# Patient Record
Sex: Female | Born: 1960 | Race: Black or African American | Hispanic: No | Marital: Married | Smoking: Never smoker
Health system: Southern US, Community
[De-identification: ages and names within clinical notes are randomized; demographics above are authoritative.]

## PROBLEM LIST (undated history)

## (undated) DIAGNOSIS — F32A Depression, unspecified: Secondary | ICD-10-CM

## (undated) DIAGNOSIS — D219 Benign neoplasm of connective and other soft tissue, unspecified: Secondary | ICD-10-CM

## (undated) DIAGNOSIS — F329 Major depressive disorder, single episode, unspecified: Secondary | ICD-10-CM

## (undated) DIAGNOSIS — F419 Anxiety disorder, unspecified: Secondary | ICD-10-CM

## (undated) HISTORY — DX: Anxiety disorder, unspecified: F41.9

## (undated) HISTORY — DX: Major depressive disorder, single episode, unspecified: F32.9

## (undated) HISTORY — DX: Depression, unspecified: F32.A

## (undated) HISTORY — DX: Benign neoplasm of connective and other soft tissue, unspecified: D21.9

## (undated) HISTORY — PX: UTERINE FIBROID SURGERY: SHX826

## (undated) HISTORY — PX: BREAST EXCISIONAL BIOPSY: SUR124

---

## 1999-05-02 ENCOUNTER — Ambulatory Visit (HOSPITAL_COMMUNITY): Admission: RE | Admit: 1999-05-02 | Discharge: 1999-05-02 | Payer: Self-pay | Admitting: General Surgery

## 1999-05-02 ENCOUNTER — Encounter (HOSPITAL_BASED_OUTPATIENT_CLINIC_OR_DEPARTMENT_OTHER): Payer: Self-pay | Admitting: General Surgery

## 2000-08-03 ENCOUNTER — Ambulatory Visit (HOSPITAL_COMMUNITY): Admission: RE | Admit: 2000-08-03 | Discharge: 2000-08-03 | Payer: Self-pay | Admitting: Obstetrics and Gynecology

## 2000-08-03 ENCOUNTER — Encounter: Payer: Self-pay | Admitting: Obstetrics and Gynecology

## 2001-09-01 ENCOUNTER — Other Ambulatory Visit: Admission: RE | Admit: 2001-09-01 | Discharge: 2001-09-01 | Payer: Self-pay | Admitting: Obstetrics and Gynecology

## 2001-09-14 ENCOUNTER — Ambulatory Visit (HOSPITAL_COMMUNITY): Admission: RE | Admit: 2001-09-14 | Discharge: 2001-09-14 | Payer: Self-pay | Admitting: Obstetrics and Gynecology

## 2001-09-14 ENCOUNTER — Encounter: Payer: Self-pay | Admitting: Obstetrics and Gynecology

## 2002-08-17 ENCOUNTER — Other Ambulatory Visit: Admission: RE | Admit: 2002-08-17 | Discharge: 2002-08-17 | Payer: Self-pay | Admitting: Obstetrics and Gynecology

## 2002-09-29 ENCOUNTER — Ambulatory Visit (HOSPITAL_COMMUNITY): Admission: RE | Admit: 2002-09-29 | Discharge: 2002-09-29 | Payer: Self-pay | Admitting: Obstetrics and Gynecology

## 2002-09-29 ENCOUNTER — Encounter: Payer: Self-pay | Admitting: Obstetrics and Gynecology

## 2003-07-14 ENCOUNTER — Ambulatory Visit (HOSPITAL_COMMUNITY): Admission: RE | Admit: 2003-07-14 | Discharge: 2003-07-14 | Payer: Self-pay | Admitting: Family Medicine

## 2003-07-24 ENCOUNTER — Ambulatory Visit (HOSPITAL_COMMUNITY): Admission: RE | Admit: 2003-07-24 | Discharge: 2003-07-24 | Payer: Self-pay | Admitting: Family Medicine

## 2003-10-10 ENCOUNTER — Other Ambulatory Visit: Admission: RE | Admit: 2003-10-10 | Discharge: 2003-10-10 | Payer: Self-pay | Admitting: Obstetrics and Gynecology

## 2004-05-21 ENCOUNTER — Other Ambulatory Visit: Admission: RE | Admit: 2004-05-21 | Discharge: 2004-05-21 | Payer: Self-pay | Admitting: Obstetrics and Gynecology

## 2004-09-19 ENCOUNTER — Emergency Department (HOSPITAL_COMMUNITY): Admission: EM | Admit: 2004-09-19 | Discharge: 2004-09-19 | Payer: Self-pay | Admitting: Emergency Medicine

## 2004-10-22 ENCOUNTER — Other Ambulatory Visit: Admission: RE | Admit: 2004-10-22 | Discharge: 2004-10-22 | Payer: Self-pay | Admitting: Obstetrics and Gynecology

## 2004-10-29 ENCOUNTER — Ambulatory Visit (HOSPITAL_COMMUNITY): Admission: RE | Admit: 2004-10-29 | Discharge: 2004-10-29 | Payer: Self-pay | Admitting: Obstetrics and Gynecology

## 2005-05-02 ENCOUNTER — Encounter: Admission: RE | Admit: 2005-05-02 | Discharge: 2005-05-02 | Payer: Self-pay | Admitting: Obstetrics and Gynecology

## 2005-11-11 ENCOUNTER — Encounter: Admission: RE | Admit: 2005-11-11 | Discharge: 2005-11-11 | Payer: Self-pay | Admitting: Obstetrics and Gynecology

## 2005-11-20 ENCOUNTER — Other Ambulatory Visit: Admission: RE | Admit: 2005-11-20 | Discharge: 2005-11-20 | Payer: Self-pay | Admitting: Obstetrics and Gynecology

## 2006-11-30 ENCOUNTER — Ambulatory Visit (HOSPITAL_COMMUNITY): Admission: RE | Admit: 2006-11-30 | Discharge: 2006-11-30 | Payer: Self-pay | Admitting: Obstetrics and Gynecology

## 2007-12-13 ENCOUNTER — Ambulatory Visit (HOSPITAL_COMMUNITY): Admission: RE | Admit: 2007-12-13 | Discharge: 2007-12-13 | Payer: Self-pay | Admitting: Obstetrics and Gynecology

## 2008-12-21 ENCOUNTER — Ambulatory Visit (HOSPITAL_COMMUNITY): Admission: RE | Admit: 2008-12-21 | Discharge: 2008-12-21 | Payer: Self-pay | Admitting: Obstetrics and Gynecology

## 2010-01-10 ENCOUNTER — Ambulatory Visit (HOSPITAL_COMMUNITY): Admission: RE | Admit: 2010-01-10 | Discharge: 2010-01-10 | Payer: Self-pay | Admitting: Obstetrics and Gynecology

## 2011-02-25 ENCOUNTER — Other Ambulatory Visit (HOSPITAL_COMMUNITY): Payer: Self-pay | Admitting: Obstetrics and Gynecology

## 2011-02-25 DIAGNOSIS — Z1231 Encounter for screening mammogram for malignant neoplasm of breast: Secondary | ICD-10-CM

## 2011-03-06 ENCOUNTER — Ambulatory Visit (HOSPITAL_COMMUNITY)
Admission: RE | Admit: 2011-03-06 | Discharge: 2011-03-06 | Disposition: A | Discharge status: Home / Self Care | Payer: BC Managed Care – PPO | Source: Ambulatory Visit | Attending: Obstetrics and Gynecology | Admitting: Obstetrics and Gynecology

## 2011-03-06 DIAGNOSIS — Z1231 Encounter for screening mammogram for malignant neoplasm of breast: Secondary | ICD-10-CM | POA: Insufficient documentation

## 2011-12-29 ENCOUNTER — Other Ambulatory Visit: Payer: Self-pay | Admitting: Obstetrics and Gynecology

## 2011-12-29 MED ORDER — NORETHINDRONE 0.35 MG PO TABS: 1.0000 | ORAL_TABLET | Freq: Every day | ORAL | Status: DC

## 2011-12-29 NOTE — Telephone Encounter (Signed)
Chandra/cht received 

## 2011-12-29 NOTE — Telephone Encounter (Signed)
Tc to pt. Wants Camila written for a 30 day supply monthly vs 90 day supply. Rx e-pres to pharm on file. Pt voices undesrtanding.

## 2012-01-08 ENCOUNTER — Other Ambulatory Visit: Payer: Self-pay | Admitting: Obstetrics and Gynecology

## 2012-01-08 NOTE — Telephone Encounter (Signed)
Tabitha Zimmerman/epic 

## 2012-01-08 NOTE — Telephone Encounter (Signed)
Tc from pt per telephone call. Pt was given rf of generic Camila last week;however opts to have Camila brand name only. Rx for Camila(brand name only) with 5 rf's called to [facility]([address]) [phone]. Pt voices understanding. Pt will complete Norethindrone pill pack with this cycle and start Camila with next cycle.

## 2012-05-10 ENCOUNTER — Other Ambulatory Visit: Payer: Self-pay | Admitting: Obstetrics and Gynecology

## 2012-05-10 DIAGNOSIS — Z1231 Encounter for screening mammogram for malignant neoplasm of breast: Secondary | ICD-10-CM

## 2012-05-26 ENCOUNTER — Ambulatory Visit (HOSPITAL_COMMUNITY)
Admission: RE | Admit: 2012-05-26 | Discharge: 2012-05-26 | Disposition: A | Discharge status: Home / Self Care | Payer: BC Managed Care – HMO | Source: Ambulatory Visit | Attending: Obstetrics and Gynecology | Admitting: Obstetrics and Gynecology

## 2012-05-26 DIAGNOSIS — Z1231 Encounter for screening mammogram for malignant neoplasm of breast: Secondary | ICD-10-CM | POA: Insufficient documentation

## 2012-05-27 ENCOUNTER — Telehealth: Payer: Self-pay | Admitting: Obstetrics and Gynecology

## 2012-05-27 ENCOUNTER — Ambulatory Visit (HOSPITAL_COMMUNITY): Payer: BC Managed Care – PPO

## 2012-05-27 MED ORDER — NORETHINDRONE 0.35 MG PO TABS: 1.0000 | ORAL_TABLET | Freq: Every day | ORAL | Status: DC

## 2012-05-27 NOTE — Telephone Encounter (Signed)
Tc to pt per telephone call. Rx for Camilla e-pres to pharm on file. Pt voices understanding.

## 2012-06-14 ENCOUNTER — Other Ambulatory Visit: Payer: Self-pay | Admitting: Obstetrics and Gynecology

## 2012-06-23 ENCOUNTER — Encounter: Payer: Self-pay | Admitting: Obstetrics and Gynecology

## 2012-06-23 DIAGNOSIS — R922 Inconclusive mammogram: Secondary | ICD-10-CM | POA: Insufficient documentation

## 2012-09-02 ENCOUNTER — Encounter: Payer: Self-pay | Admitting: Obstetrics and Gynecology

## 2012-09-06 ENCOUNTER — Ambulatory Visit: Payer: BC Managed Care – PPO | Admitting: Obstetrics and Gynecology

## 2012-09-06 ENCOUNTER — Encounter: Payer: Self-pay | Admitting: Obstetrics and Gynecology

## 2012-09-06 VITALS — BP 128/74 | HR 94 | Ht 63.0 in | Wt 167.0 lb

## 2012-09-06 DIAGNOSIS — N912 Amenorrhea, unspecified: Secondary | ICD-10-CM

## 2012-09-06 DIAGNOSIS — Z124 Encounter for screening for malignant neoplasm of cervix: Secondary | ICD-10-CM

## 2012-09-06 DIAGNOSIS — R922 Inconclusive mammogram: Secondary | ICD-10-CM

## 2012-09-06 DIAGNOSIS — R110 Nausea: Secondary | ICD-10-CM

## 2012-09-06 DIAGNOSIS — D219 Benign neoplasm of connective and other soft tissue, unspecified: Secondary | ICD-10-CM

## 2012-09-06 MED ORDER — PROMETHAZINE HCL 25 MG PO TABS: 25.0000 mg | ORAL_TABLET | Freq: Four times a day (QID) | ORAL | Status: AC | PRN

## 2012-09-06 NOTE — Progress Notes (Signed)
Subjective:  Last Pap: 02/07/09 WNL: Yes Regular Periods:no  No menses since changing to progesterone only pill 2 years ago. Contraception: pill  Monthly Breast exam:yes Tetanus<69yrs:yes Nl.Bladder Function:yes Daily BMs:no Healthy Diet:no Calcium:no Mammogram:yes Date of Mammogram: 05/2012 Exercise:no Have often Exercise: n/a Seatbelt: yes Abuse at home: no Stressful work:yes Sigmoid-colonoscopy: yes 03/2012 Bone Density: No PCP: Dr. Elias Else Change in PMH: no changes  Change in [facility]: no changes  INA SCRIVENS is a 52 y.o. female, G2P1011, who presents for an annual exam. No menopausal sx    History   Social History  . Marital Status: Married    Spouse Name: N/A    Number of Children: N/A  . Years of Education: N/A   Social History Main Topics  . Smoking status: Never Smoker   . Smokeless tobacco: None  . Alcohol Use: No  . Drug Use: No  . Sexually Active: Yes    Birth Control/ Protection: Pill     Comment: camilla   Other Topics Concern  . None   Social History Narrative  . None    Menstrual cycle:   LMP: No LMP recorded. Patient is not currently having periods (Reason: Oral contraceptives).           Cycle: none  The following portions of the patient's history were reviewed and updated as appropriate: allergies, current medications, past family history, past medical history, past social history, past surgical history and problem list.  Review of Systems Pertinent items are noted in HPI. Breast:Negative for breast lump,nipple discharge or nipple retraction Gastrointestinal: Negative for abdominal pain, change in bowel habits or rectal bleeding.  Has chronic nausea which has been worked up without etiology noted, had resolved, but has returned in the last few weeks Urinary:negative   Objective:    BP 128/74  Pulse 94  Ht 5\' 3"  (1.6 m)  Wt 167 lb (75.751 kg)  BMI 29.59 kg/m2    Weight:  Wt Readings from Last 1 Encounters:  09/06/12 167 lb  (75.751 kg)          BMI: Body mass index is 29.59 kg/(m^2).  General Appearance: Alert, appropriate appearance for age. No acute distress HEENT: Grossly normal Neck / Thyroid: Supple, no masses, nodes or enlargement Lungs: clear to auscultation bilaterally Back: No CVA tenderness Breast Exam: No masses or nodes.No dimpling, nipple retraction or discharge. Cardiovascular: Regular rate and rhythm. S1, S2, no murmur Gastrointestinal: Soft, non-tender, no masses or organomegaly Pelvic Exam: External genitalia: normal general appearance Vaginal: atrophic mucosa Cervix: displaced anteriorly by large posterior uterine fibroid Adnexa: non palpable and not separable from fibroid uterus Uterus: irregular enlargement probably 10-12 wks size (last U/S 12/12 was 12 wks size with 7 measurable fibroids) Exam limited by Large fibroids Rectovaginal: normal rectal, no masses Lymphatic Exam: Non-palpable nodes in neck, clavicular, axillary, or inguinal regions Skin: no rash or abnormalities Neurologic: Normal gait and speech, no tremor  Psychiatric: Alert and oriented, appropriate affect.   Wet Prep:not applicable Urinalysis:not applicable UPT: Not done   Assessment:    Large asymptomatic fibroids, recurrent s/p myomectomy in 1998  Dense breasts without other risk factors   Plan:    mammogram pap smear with HR HPV  return annually or prn Contraception:oral progesterone-only contraceptive FSH, E2.  If menopausal, will continue bcps for 1 additional year then d/c   Dierdre Forth MD

## 2012-09-09 ENCOUNTER — Other Ambulatory Visit: Payer: Self-pay

## 2012-09-09 MED ORDER — NORETHINDRONE 0.35 MG PO TABS: 1.0000 | ORAL_TABLET | Freq: Every day | ORAL | Status: AC

## 2013-09-14 ENCOUNTER — Other Ambulatory Visit: Payer: Self-pay | Admitting: Obstetrics and Gynecology

## 2013-09-14 DIAGNOSIS — Z1231 Encounter for screening mammogram for malignant neoplasm of breast: Secondary | ICD-10-CM

## 2013-09-20 ENCOUNTER — Ambulatory Visit (HOSPITAL_COMMUNITY)
Admission: RE | Admit: 2013-09-20 | Discharge: 2013-09-20 | Disposition: A | Discharge status: Home / Self Care | Payer: 59 | Source: Ambulatory Visit | Attending: Obstetrics and Gynecology | Admitting: Obstetrics and Gynecology

## 2013-09-20 DIAGNOSIS — Z1231 Encounter for screening mammogram for malignant neoplasm of breast: Secondary | ICD-10-CM | POA: Insufficient documentation

## 2014-05-15 ENCOUNTER — Encounter: Payer: Self-pay | Admitting: Obstetrics and Gynecology

## 2014-12-06 ENCOUNTER — Other Ambulatory Visit (HOSPITAL_COMMUNITY): Payer: Self-pay | Admitting: Obstetrics and Gynecology

## 2014-12-06 DIAGNOSIS — Z1231 Encounter for screening mammogram for malignant neoplasm of breast: Secondary | ICD-10-CM

## 2014-12-14 ENCOUNTER — Ambulatory Visit (HOSPITAL_COMMUNITY)
Admission: RE | Admit: 2014-12-14 | Discharge: 2014-12-14 | Disposition: A | Discharge status: Home / Self Care | Payer: Managed Care, Other (non HMO) | Source: Ambulatory Visit | Attending: Obstetrics and Gynecology | Admitting: Obstetrics and Gynecology

## 2014-12-14 DIAGNOSIS — Z1231 Encounter for screening mammogram for malignant neoplasm of breast: Secondary | ICD-10-CM | POA: Insufficient documentation

## 2016-02-26 ENCOUNTER — Other Ambulatory Visit: Payer: Self-pay | Admitting: Obstetrics and Gynecology

## 2016-02-26 DIAGNOSIS — Z1231 Encounter for screening mammogram for malignant neoplasm of breast: Secondary | ICD-10-CM

## 2016-03-05 ENCOUNTER — Other Ambulatory Visit: Payer: Self-pay | Admitting: Obstetrics and Gynecology

## 2016-03-05 ENCOUNTER — Ambulatory Visit
Admission: RE | Admit: 2016-03-05 | Discharge: 2016-03-05 | Disposition: A | Discharge status: Home / Self Care | Payer: Managed Care, Other (non HMO) | Source: Ambulatory Visit | Attending: Obstetrics and Gynecology | Admitting: Obstetrics and Gynecology

## 2016-03-05 DIAGNOSIS — Z1231 Encounter for screening mammogram for malignant neoplasm of breast: Secondary | ICD-10-CM

## 2016-03-05 DIAGNOSIS — N63 Unspecified lump in unspecified breast: Secondary | ICD-10-CM

## 2016-03-11 ENCOUNTER — Other Ambulatory Visit: Payer: Self-pay | Admitting: Obstetrics and Gynecology

## 2016-03-11 DIAGNOSIS — N63 Unspecified lump in unspecified breast: Secondary | ICD-10-CM

## 2016-03-14 ENCOUNTER — Ambulatory Visit
Admission: RE | Admit: 2016-03-14 | Discharge: 2016-03-14 | Disposition: A | Discharge status: Home / Self Care | Payer: Managed Care, Other (non HMO) | Source: Ambulatory Visit | Attending: Obstetrics and Gynecology | Admitting: Obstetrics and Gynecology

## 2016-03-14 DIAGNOSIS — N63 Unspecified lump in unspecified breast: Secondary | ICD-10-CM

## 2017-05-19 ENCOUNTER — Other Ambulatory Visit: Payer: Self-pay | Admitting: Obstetrics and Gynecology

## 2017-05-19 ENCOUNTER — Ambulatory Visit
Admission: RE | Admit: 2017-05-19 | Discharge: 2017-05-19 | Disposition: A | Discharge status: Home / Self Care | Payer: BLUE CROSS/BLUE SHIELD | Source: Ambulatory Visit | Attending: Obstetrics and Gynecology | Admitting: Obstetrics and Gynecology

## 2017-05-19 DIAGNOSIS — Z1231 Encounter for screening mammogram for malignant neoplasm of breast: Secondary | ICD-10-CM | POA: Diagnosis not present

## 2017-05-20 ENCOUNTER — Ambulatory Visit: Payer: Managed Care, Other (non HMO)

## 2017-07-02 DIAGNOSIS — Z01411 Encounter for gynecological examination (general) (routine) with abnormal findings: Secondary | ICD-10-CM | POA: Diagnosis not present

## 2017-07-02 DIAGNOSIS — D259 Leiomyoma of uterus, unspecified: Secondary | ICD-10-CM | POA: Diagnosis not present

## 2017-07-02 DIAGNOSIS — L900 Lichen sclerosus et atrophicus: Secondary | ICD-10-CM | POA: Diagnosis not present

## 2017-08-27 DIAGNOSIS — Z23 Encounter for immunization: Secondary | ICD-10-CM | POA: Diagnosis not present

## 2017-08-27 DIAGNOSIS — R110 Nausea: Secondary | ICD-10-CM | POA: Diagnosis not present

## 2017-08-27 DIAGNOSIS — Z0000 Encounter for general adult medical examination without abnormal findings: Secondary | ICD-10-CM | POA: Diagnosis not present

## 2017-08-27 DIAGNOSIS — F324 Major depressive disorder, single episode, in partial remission: Secondary | ICD-10-CM | POA: Diagnosis not present

## 2017-08-27 DIAGNOSIS — E7800 Pure hypercholesterolemia, unspecified: Secondary | ICD-10-CM | POA: Diagnosis not present

## 2017-09-10 DIAGNOSIS — E7800 Pure hypercholesterolemia, unspecified: Secondary | ICD-10-CM | POA: Diagnosis not present

## 2017-09-10 DIAGNOSIS — Z1159 Encounter for screening for other viral diseases: Secondary | ICD-10-CM | POA: Diagnosis not present

## 2018-05-18 ENCOUNTER — Other Ambulatory Visit: Payer: Self-pay | Admitting: Obstetrics and Gynecology

## 2018-05-18 DIAGNOSIS — Z1231 Encounter for screening mammogram for malignant neoplasm of breast: Secondary | ICD-10-CM

## 2018-06-28 ENCOUNTER — Ambulatory Visit
Admission: RE | Admit: 2018-06-28 | Discharge: 2018-06-28 | Disposition: A | Discharge status: Home / Self Care | Payer: BLUE CROSS/BLUE SHIELD | Source: Ambulatory Visit | Attending: Obstetrics and Gynecology | Admitting: Obstetrics and Gynecology

## 2018-06-28 DIAGNOSIS — Z1231 Encounter for screening mammogram for malignant neoplasm of breast: Secondary | ICD-10-CM

## 2018-07-08 DIAGNOSIS — L900 Lichen sclerosus et atrophicus: Secondary | ICD-10-CM | POA: Diagnosis not present

## 2018-07-08 DIAGNOSIS — Z01411 Encounter for gynecological examination (general) (routine) with abnormal findings: Secondary | ICD-10-CM | POA: Diagnosis not present

## 2018-07-08 DIAGNOSIS — Z6829 Body mass index (BMI) 29.0-29.9, adult: Secondary | ICD-10-CM | POA: Diagnosis not present

## 2018-07-08 DIAGNOSIS — Z124 Encounter for screening for malignant neoplasm of cervix: Secondary | ICD-10-CM | POA: Diagnosis not present

## 2018-07-25 DIAGNOSIS — J209 Acute bronchitis, unspecified: Secondary | ICD-10-CM | POA: Diagnosis not present

## 2018-10-28 DIAGNOSIS — Z0000 Encounter for general adult medical examination without abnormal findings: Secondary | ICD-10-CM | POA: Diagnosis not present

## 2018-10-28 DIAGNOSIS — R110 Nausea: Secondary | ICD-10-CM | POA: Diagnosis not present

## 2018-10-28 DIAGNOSIS — E7800 Pure hypercholesterolemia, unspecified: Secondary | ICD-10-CM | POA: Diagnosis not present

## 2018-10-28 DIAGNOSIS — Z13220 Encounter for screening for lipoid disorders: Secondary | ICD-10-CM | POA: Diagnosis not present

## 2018-10-28 DIAGNOSIS — F324 Major depressive disorder, single episode, in partial remission: Secondary | ICD-10-CM | POA: Diagnosis not present

## 2018-11-01 DIAGNOSIS — E7800 Pure hypercholesterolemia, unspecified: Secondary | ICD-10-CM | POA: Diagnosis not present

## 2018-12-17 DIAGNOSIS — K219 Gastro-esophageal reflux disease without esophagitis: Secondary | ICD-10-CM | POA: Diagnosis not present

## 2018-12-17 DIAGNOSIS — R1032 Left lower quadrant pain: Secondary | ICD-10-CM | POA: Diagnosis not present

## 2018-12-17 DIAGNOSIS — K5900 Constipation, unspecified: Secondary | ICD-10-CM | POA: Diagnosis not present

## 2019-01-18 ENCOUNTER — Other Ambulatory Visit: Payer: Self-pay | Admitting: *Deleted

## 2019-01-18 DIAGNOSIS — Z20822 Contact with and (suspected) exposure to covid-19: Secondary | ICD-10-CM

## 2019-01-24 LAB — NOVEL CORONAVIRUS, NAA: SARS-CoV-2, NAA: NOT DETECTED

## 2019-01-25 ENCOUNTER — Telehealth: Payer: Self-pay | Admitting: Family Medicine

## 2019-01-25 NOTE — Telephone Encounter (Signed)
Patient called and received test results

## 2019-06-24 ENCOUNTER — Other Ambulatory Visit: Payer: Self-pay | Admitting: Family Medicine

## 2019-06-24 DIAGNOSIS — Z1231 Encounter for screening mammogram for malignant neoplasm of breast: Secondary | ICD-10-CM

## 2019-06-30 ENCOUNTER — Other Ambulatory Visit: Payer: Self-pay

## 2019-06-30 ENCOUNTER — Ambulatory Visit
Admission: RE | Admit: 2019-06-30 | Discharge: 2019-06-30 | Disposition: A | Discharge status: Home / Self Care | Payer: BC Managed Care – PPO | Source: Ambulatory Visit | Attending: Family Medicine | Admitting: Family Medicine

## 2019-06-30 DIAGNOSIS — Z1231 Encounter for screening mammogram for malignant neoplasm of breast: Secondary | ICD-10-CM

## 2019-07-28 DIAGNOSIS — Z20822 Contact with and (suspected) exposure to covid-19: Secondary | ICD-10-CM | POA: Diagnosis not present

## 2019-10-31 DIAGNOSIS — E7800 Pure hypercholesterolemia, unspecified: Secondary | ICD-10-CM | POA: Diagnosis not present

## 2019-10-31 DIAGNOSIS — Z0000 Encounter for general adult medical examination without abnormal findings: Secondary | ICD-10-CM | POA: Diagnosis not present

## 2019-10-31 DIAGNOSIS — Z130 Encounter for screening for diseases of the blood and blood-forming organs and certain disorders involving the immune mechanism: Secondary | ICD-10-CM | POA: Diagnosis not present

## 2020-09-29 IMAGING — MG DIGITAL SCREENING BILAT W/ TOMO W/ CAD
8 series · 8 of 24 positions shown · non-contrast
Comparison: Previous exam(s).

CLINICAL DATA: Screening.

EXAM:
DIGITAL SCREENING BILATERAL MAMMOGRAM WITH TOMO AND CAD

[R CC synth-2D]
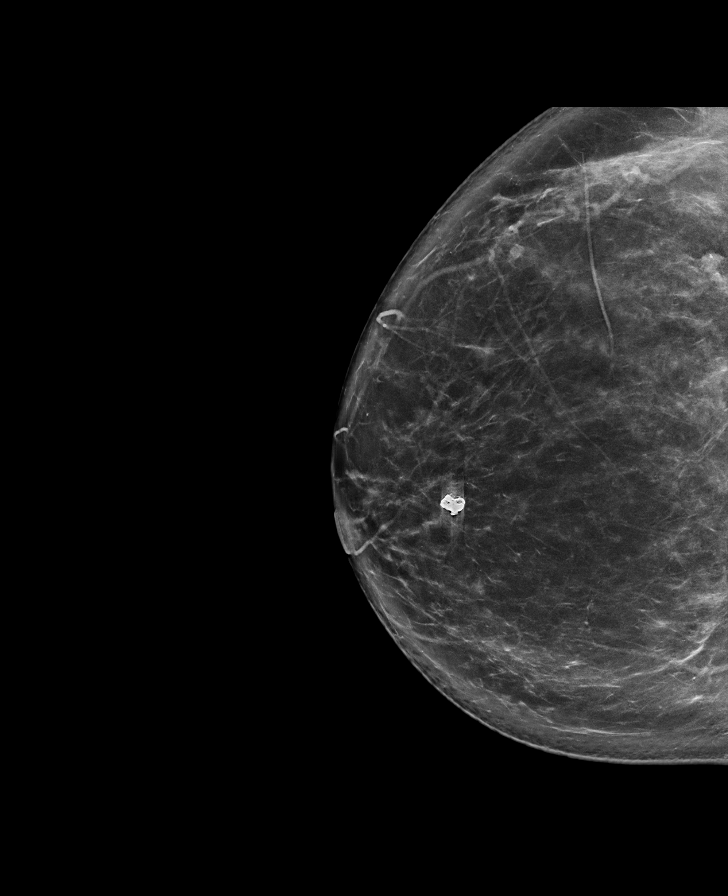

[R MLO synth-2D]
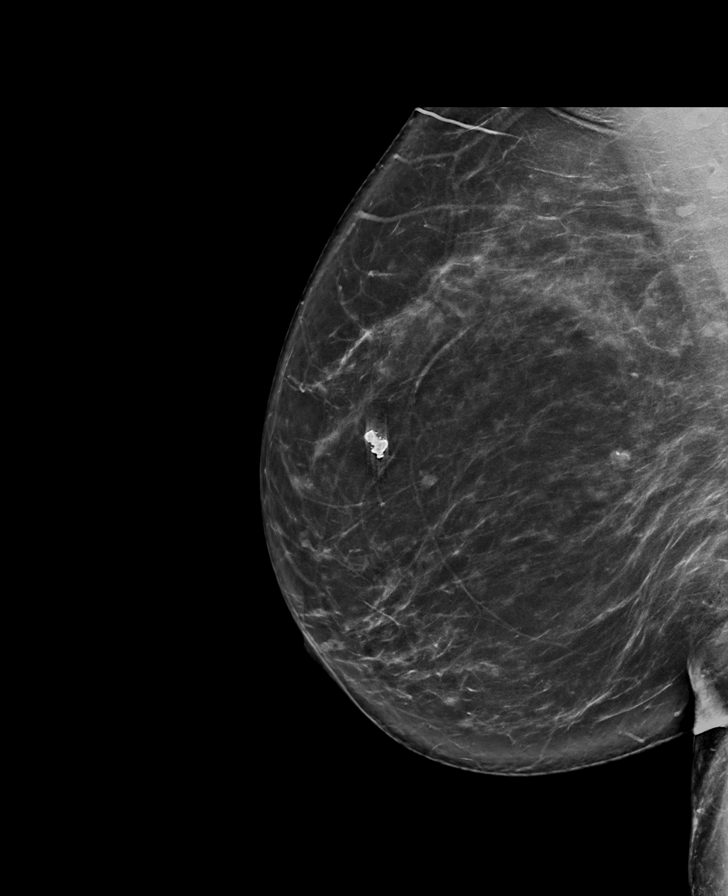

[L MLO synth-2D]
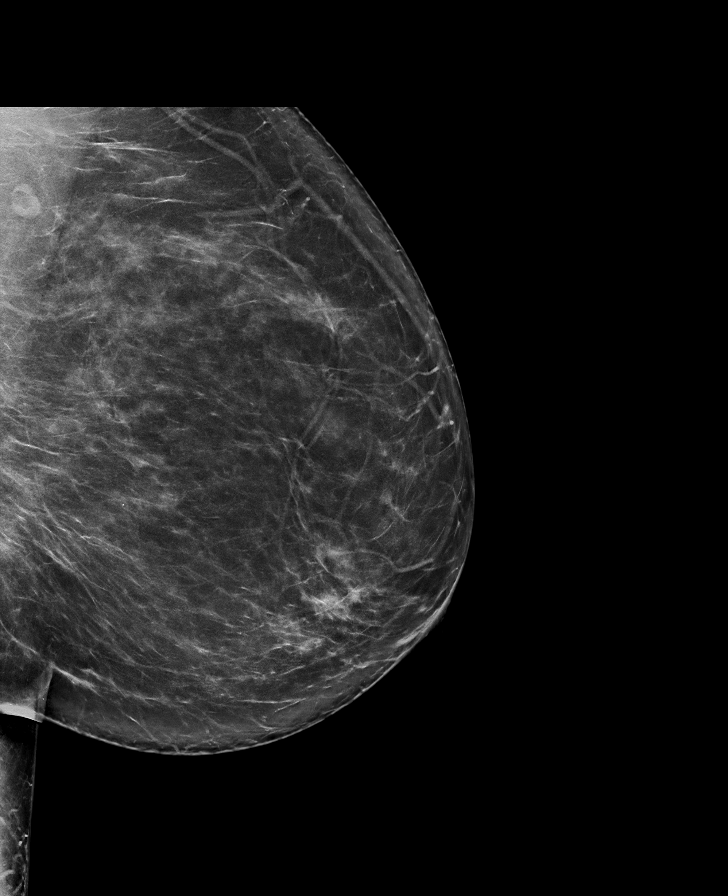

[L CC synth-2D]
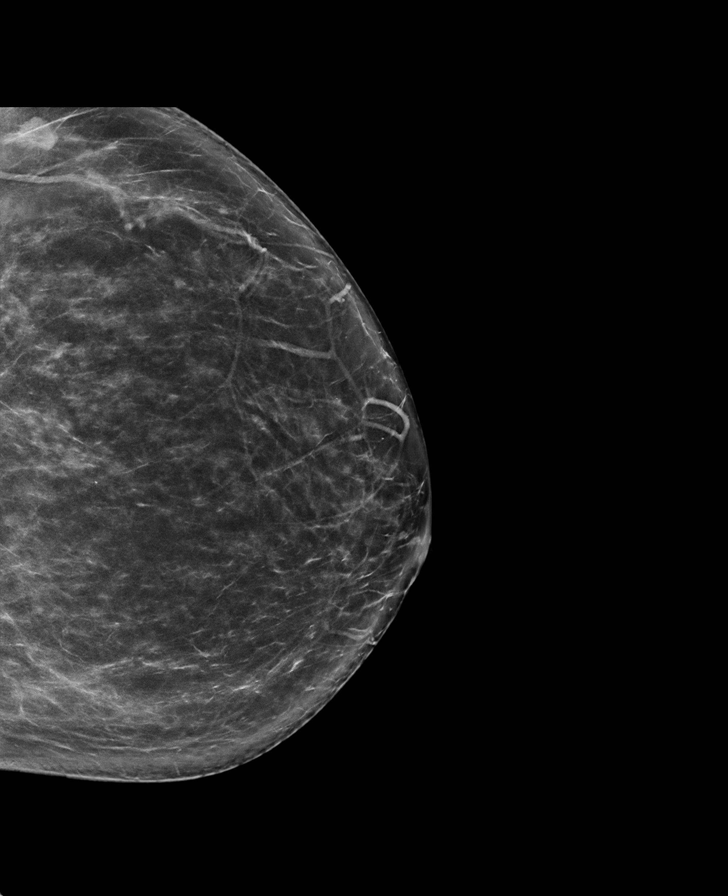

[R CC tomo · tomo slice 47/93.0]
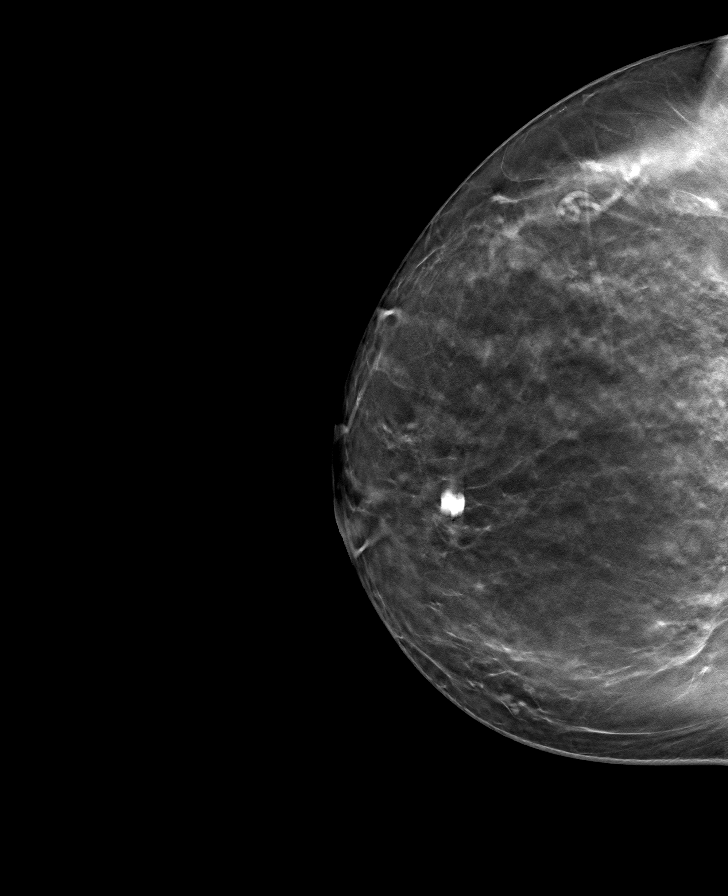

[L CC tomo · tomo slice 42/83.0]
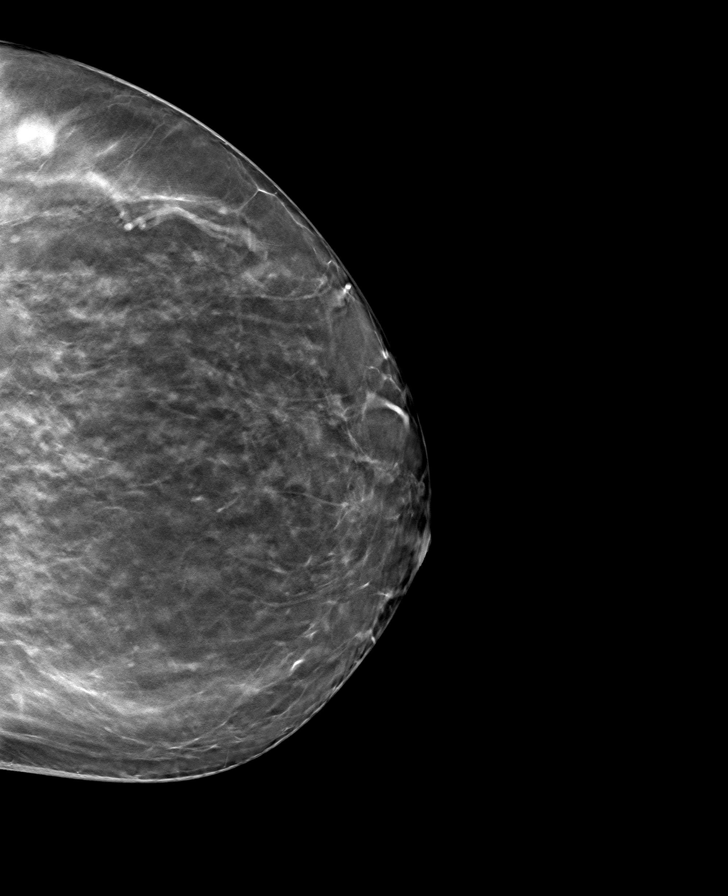

[R MLO tomo · tomo slice 47/93.0]
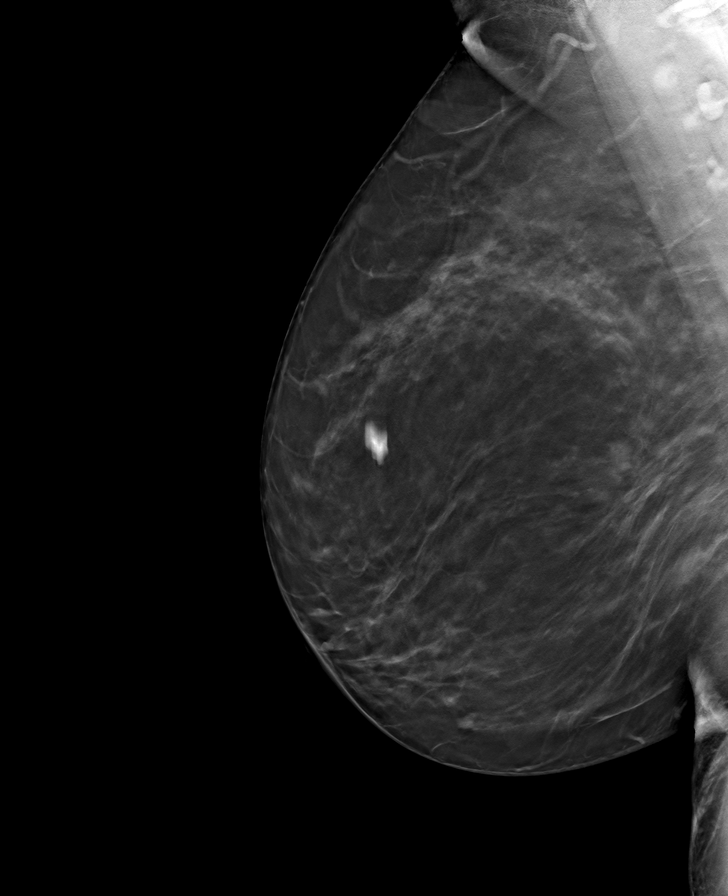

[L MLO tomo · tomo slice 45/90.0]
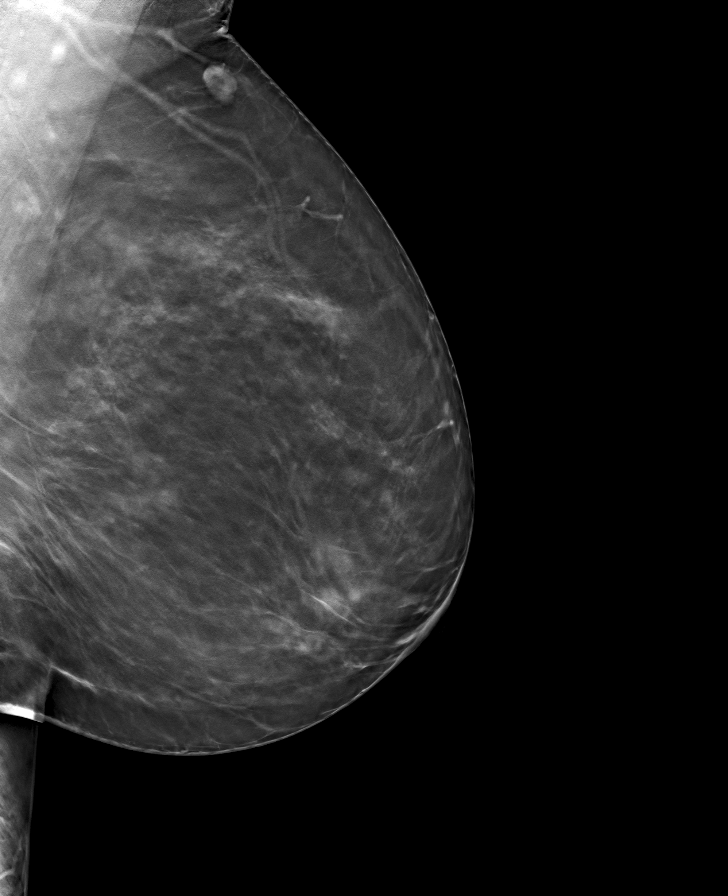

[8 of 24 positions shown; findings below may reference images not displayed]

ACR Breast Density Category b: There are scattered areas of
fibroglandular density.
FINDINGS: There are no findings suspicious for malignancy. Images were
processed with CAD.
IMPRESSION: No mammographic evidence of malignancy. A result letter of this
screening mammogram will be mailed directly to the patient.

RECOMMENDATION:
Screening mammogram in one year. (Code:[id number])

BI-RADS CATEGORY  1: Negative.

## 2020-11-06 DIAGNOSIS — E7800 Pure hypercholesterolemia, unspecified: Secondary | ICD-10-CM | POA: Diagnosis not present

## 2020-11-06 DIAGNOSIS — Z0000 Encounter for general adult medical examination without abnormal findings: Secondary | ICD-10-CM | POA: Diagnosis not present

## 2020-11-06 DIAGNOSIS — R110 Nausea: Secondary | ICD-10-CM | POA: Diagnosis not present

## 2021-01-24 DIAGNOSIS — Z6830 Body mass index (BMI) 30.0-30.9, adult: Secondary | ICD-10-CM | POA: Diagnosis not present

## 2021-01-24 DIAGNOSIS — Z01419 Encounter for gynecological examination (general) (routine) without abnormal findings: Secondary | ICD-10-CM | POA: Diagnosis not present

## 2021-02-19 DIAGNOSIS — U071 COVID-19: Secondary | ICD-10-CM | POA: Diagnosis not present

## 2021-03-12 DIAGNOSIS — Z1231 Encounter for screening mammogram for malignant neoplasm of breast: Secondary | ICD-10-CM | POA: Diagnosis not present

## 2021-11-26 DIAGNOSIS — F330 Major depressive disorder, recurrent, mild: Secondary | ICD-10-CM | POA: Diagnosis not present

## 2021-11-26 DIAGNOSIS — Z0000 Encounter for general adult medical examination without abnormal findings: Secondary | ICD-10-CM | POA: Diagnosis not present

## 2021-11-26 DIAGNOSIS — E7800 Pure hypercholesterolemia, unspecified: Secondary | ICD-10-CM | POA: Diagnosis not present

## 2021-11-26 DIAGNOSIS — R110 Nausea: Secondary | ICD-10-CM | POA: Diagnosis not present

## 2021-11-26 DIAGNOSIS — D509 Iron deficiency anemia, unspecified: Secondary | ICD-10-CM | POA: Diagnosis not present

## 2021-11-26 DIAGNOSIS — Z20820 Contact with and (suspected) exposure to varicella: Secondary | ICD-10-CM | POA: Diagnosis not present

## 2022-02-12 DIAGNOSIS — E7800 Pure hypercholesterolemia, unspecified: Secondary | ICD-10-CM | POA: Diagnosis not present

## 2022-03-18 DIAGNOSIS — Z01419 Encounter for gynecological examination (general) (routine) without abnormal findings: Secondary | ICD-10-CM | POA: Diagnosis not present

## 2022-03-18 DIAGNOSIS — Z6830 Body mass index (BMI) 30.0-30.9, adult: Secondary | ICD-10-CM | POA: Diagnosis not present

## 2022-03-18 DIAGNOSIS — Z1231 Encounter for screening mammogram for malignant neoplasm of breast: Secondary | ICD-10-CM | POA: Diagnosis not present

## 2022-05-28 DIAGNOSIS — R42 Dizziness and giddiness: Secondary | ICD-10-CM | POA: Diagnosis not present

## 2022-06-11 DIAGNOSIS — D125 Benign neoplasm of sigmoid colon: Secondary | ICD-10-CM | POA: Diagnosis not present

## 2022-06-11 DIAGNOSIS — K648 Other hemorrhoids: Secondary | ICD-10-CM | POA: Diagnosis not present

## 2022-06-11 DIAGNOSIS — Z1211 Encounter for screening for malignant neoplasm of colon: Secondary | ICD-10-CM | POA: Diagnosis not present

## 2022-06-11 DIAGNOSIS — D122 Benign neoplasm of ascending colon: Secondary | ICD-10-CM | POA: Diagnosis not present

## 2022-06-11 DIAGNOSIS — K5730 Diverticulosis of large intestine without perforation or abscess without bleeding: Secondary | ICD-10-CM | POA: Diagnosis not present

## 2022-12-24 DIAGNOSIS — Z0000 Encounter for general adult medical examination without abnormal findings: Secondary | ICD-10-CM | POA: Diagnosis not present

## 2022-12-24 DIAGNOSIS — F330 Major depressive disorder, recurrent, mild: Secondary | ICD-10-CM | POA: Diagnosis not present

## 2022-12-24 DIAGNOSIS — E7800 Pure hypercholesterolemia, unspecified: Secondary | ICD-10-CM | POA: Diagnosis not present

## 2022-12-24 DIAGNOSIS — R110 Nausea: Secondary | ICD-10-CM | POA: Diagnosis not present

## 2022-12-24 DIAGNOSIS — K219 Gastro-esophageal reflux disease without esophagitis: Secondary | ICD-10-CM | POA: Diagnosis not present

## 2023-04-21 DIAGNOSIS — Z1231 Encounter for screening mammogram for malignant neoplasm of breast: Secondary | ICD-10-CM | POA: Diagnosis not present

## 2023-04-21 DIAGNOSIS — Z6829 Body mass index (BMI) 29.0-29.9, adult: Secondary | ICD-10-CM | POA: Diagnosis not present

## 2023-04-21 DIAGNOSIS — Z01419 Encounter for gynecological examination (general) (routine) without abnormal findings: Secondary | ICD-10-CM | POA: Diagnosis not present

## 2024-01-05 DIAGNOSIS — R7301 Impaired fasting glucose: Secondary | ICD-10-CM | POA: Diagnosis not present

## 2024-01-05 DIAGNOSIS — E7800 Pure hypercholesterolemia, unspecified: Secondary | ICD-10-CM | POA: Diagnosis not present

## 2024-01-05 DIAGNOSIS — Z0000 Encounter for general adult medical examination without abnormal findings: Secondary | ICD-10-CM | POA: Diagnosis not present

## 2024-01-05 DIAGNOSIS — F324 Major depressive disorder, single episode, in partial remission: Secondary | ICD-10-CM | POA: Diagnosis not present

## 2024-01-05 DIAGNOSIS — H9193 Unspecified hearing loss, bilateral: Secondary | ICD-10-CM | POA: Diagnosis not present

## 2024-01-05 DIAGNOSIS — K219 Gastro-esophageal reflux disease without esophagitis: Secondary | ICD-10-CM | POA: Diagnosis not present

## 2024-02-27 DIAGNOSIS — U071 COVID-19: Secondary | ICD-10-CM | POA: Diagnosis not present

## 2024-02-27 DIAGNOSIS — R051 Acute cough: Secondary | ICD-10-CM | POA: Diagnosis not present

## 2024-02-27 DIAGNOSIS — R0981 Nasal congestion: Secondary | ICD-10-CM | POA: Diagnosis not present

## 2024-02-27 DIAGNOSIS — R509 Fever, unspecified: Secondary | ICD-10-CM | POA: Diagnosis not present

## 2024-02-27 DIAGNOSIS — J029 Acute pharyngitis, unspecified: Secondary | ICD-10-CM | POA: Diagnosis not present

## 2024-06-01 DIAGNOSIS — Z6829 Body mass index (BMI) 29.0-29.9, adult: Secondary | ICD-10-CM | POA: Diagnosis not present

## 2024-06-01 DIAGNOSIS — Z01419 Encounter for gynecological examination (general) (routine) without abnormal findings: Secondary | ICD-10-CM | POA: Diagnosis not present

## 2024-06-01 DIAGNOSIS — Z124 Encounter for screening for malignant neoplasm of cervix: Secondary | ICD-10-CM | POA: Diagnosis not present
